# Patient Record
Sex: Female | Born: 1960 | Race: Black or African American | Hispanic: No | Marital: Single | Smoking: Current every day smoker
Health system: Southern US, Community
[De-identification: ages and names within clinical notes are randomized; demographics above are authoritative.]

## PROBLEM LIST (undated history)

## (undated) DIAGNOSIS — I10 Essential (primary) hypertension: Secondary | ICD-10-CM

---

## 2018-01-16 ENCOUNTER — Encounter (HOSPITAL_BASED_OUTPATIENT_CLINIC_OR_DEPARTMENT_OTHER): Payer: Self-pay

## 2018-01-16 ENCOUNTER — Emergency Department (HOSPITAL_BASED_OUTPATIENT_CLINIC_OR_DEPARTMENT_OTHER): Payer: Self-pay

## 2018-01-16 ENCOUNTER — Other Ambulatory Visit: Payer: Self-pay

## 2018-01-16 ENCOUNTER — Emergency Department (HOSPITAL_BASED_OUTPATIENT_CLINIC_OR_DEPARTMENT_OTHER)
Admission: EM | Admit: 2018-01-16 | Discharge: 2018-01-16 | Disposition: A | Discharge status: Home / Self Care | Payer: Self-pay | Attending: Emergency Medicine | Admitting: Emergency Medicine

## 2018-01-16 DIAGNOSIS — M25561 Pain in right knee: Secondary | ICD-10-CM | POA: Insufficient documentation

## 2018-01-16 DIAGNOSIS — F17210 Nicotine dependence, cigarettes, uncomplicated: Secondary | ICD-10-CM | POA: Insufficient documentation

## 2018-01-16 DIAGNOSIS — I10 Essential (primary) hypertension: Secondary | ICD-10-CM | POA: Insufficient documentation

## 2018-01-16 DIAGNOSIS — M25461 Effusion, right knee: Secondary | ICD-10-CM | POA: Insufficient documentation

## 2018-01-16 HISTORY — DX: Essential (primary) hypertension: I10

## 2018-01-16 MED ORDER — IBUPROFEN 800 MG PO TABS
800.0000 mg | ORAL_TABLET | Freq: Once | ORAL | Status: AC
  Administered 2018-01-16: 800 mg via ORAL
  Filled 2018-01-16: qty 1

## 2018-01-16 NOTE — ED Provider Notes (Signed)
Emergency Department Provider Note   I have reviewed the triage vital signs and the nursing notes.   HISTORY  Chief Complaint Knee Pain   HPI Tracey Richards is a 57 y.o. female with PMH of HTN presents to the emergency department evaluation of right knee pain with suprapatellar swelling.  Patient has had symptoms for the past 2 months.  She has not taken any over-the-counter medications.  She is now walking with a cane.  No injury.  Denies any fevers, chills, rash.  She noticed pain getting worse over the past several days so presents to the emergency department.  She does not follow regularly with her primary care physician. No falls. No numbness/weakness.   Past Medical History:  Diagnosis Date  . Hypertension     There are no active problems to display for this patient.   History reviewed. No pertinent surgical history.   Allergies Patient has no known allergies.  No family history on file.  Social History Social History   Tobacco Use  . Smoking status: Current Every Day Smoker    Types: Cigarettes  . Smokeless tobacco: Never Used  Substance Use Topics  . Alcohol use: Yes    Comment: occ  . Drug use: Never    Review of Systems  Constitutional: No fever/chills Eyes: No visual changes. ENT: No sore throat. Cardiovascular: Denies chest pain. Respiratory: Denies shortness of breath. Gastrointestinal: No abdominal pain.  No nausea, no vomiting.  No diarrhea.  No constipation. Genitourinary: Negative for dysuria. Musculoskeletal: Positive right knee pain.  Skin: Negative for rash. Neurological: Negative for headaches, focal weakness or numbness.  10-point ROS otherwise negative.  ____________________________________________   PHYSICAL EXAM:  VITAL SIGNS: ED Triage Vitals  Enc Vitals Group     BP 01/16/18 1112 (!) 189/100     Pulse Rate 01/16/18 1112 80     Resp 01/16/18 1112 18     Temp 01/16/18 1112 98.1 F (36.7 C)     Temp Source 01/16/18  1112 Oral     SpO2 01/16/18 1112 96 %     Weight 01/16/18 1111 188 lb (85.3 kg)     Height 01/16/18 1111 5\' 5"  (1.651 m)     Pain Score 01/16/18 1110 10   Constitutional: Alert and oriented. Well appearing and in no acute distress. Eyes: Conjunctivae are normal.  Head: Atraumatic. Nose: No congestion/rhinnorhea. Mouth/Throat: Mucous membranes are moist.  Neck: No stridor.   Cardiovascular: Well perfused.   Respiratory: Normal respiratory effort.  Gastrointestinal: No distention.  Musculoskeletal: Right suprapatellar swelling without warmth or rash. Normal ROM of the right knee. No patellar tenderness.  Neurologic: No gross focal neurologic deficits are appreciated.  Skin:  Skin is warm, dry and intact. No rash noted.  ____________________________________________  RADIOLOGY  Dg Knee 2 Views Right  Result Date: 01/16/2018 CLINICAL DATA:  Pain and swelling RIGHT knee for 4 months, denies injury, increased pain with bending knee EXAM: RIGHT KNEE - 1-2 VIEW COMPARISON:  None FINDINGS: Osseous demineralization. Joint spaces preserved. Large joint effusion. No fracture, dislocation or bone destruction. Tiny marginal spurs at lateral compartment. IMPRESSION: Minimal degenerative changes and large joint effusion RIGHT knee. No acute osseous abnormality. Electronically Signed   By: Ulyses Southward M.D.   On: 01/16/2018 11:53    ____________________________________________   PROCEDURES  Procedure(s) performed:   Procedures  None ____________________________________________   INITIAL IMPRESSION / ASSESSMENT AND PLAN / ED COURSE  Pertinent labs & imaging results that were available during my  care of the patient were reviewed by me and considered in my medical decision making (see chart for details).  Patient presents to the emergency department with right knee pain and swelling.  She describes as ongoing for the past 2 months without injury.  No clinical concern for septic arthritis.   She does have some suprapatellar swelling noted.  Normal range of motion.  Plan for plain film to evaluate for obvious bony injury.   12:03 PM Patient with effusion on x-ray and arthritis. No other acute findings. Plan for compression, elevation, and OTC pain medications at home. Provided crutches for comfort here. Referred to PCP and Sports Med. Patient is without insurance so will start with PCP and move to specialist PRN.   At this time, I do not feel there is any life-threatening condition present. I have reviewed and discussed all results (EKG, imaging, lab, urine as appropriate), exam findings with patient. I have reviewed nursing notes and appropriate previous records.  I feel the patient is safe to be discharged home without further emergent workup. Discussed usual and customary return precautions. Patient and family (if present) verbalize understanding and are comfortable with this plan.  Patient will follow-up with their primary care provider. If they do not have a primary care provider, information for follow-up has been provided to them. All questions have been answered.  ____________________________________________  FINAL CLINICAL IMPRESSION(S) / ED DIAGNOSES  Final diagnoses:  Acute pain of right knee    MEDICATIONS GIVEN DURING THIS VISIT:  Medications  ibuprofen (ADVIL,MOTRIN) tablet 800 mg (800 mg Oral Given 01/16/18 1143)    Note:  This document was prepared using Dragon voice recognition software and may include unintentional dictation errors.  Alona Bene, MD Emergency Medicine    Mitsuye Schrodt, Arlyss Repress, MD 01/16/18 [phone]

## 2018-01-16 NOTE — ED Triage Notes (Signed)
C/o pain/swelling to right knee x "months"-denies injury-to triage in w/c-NAD

## 2018-01-16 NOTE — Discharge Instructions (Signed)
We believe that your symptoms are caused by musculoskeletal strain.  Please read through the included information about additional care such as heating pads, over-the-counter pain medicine(Tylenol and/or Motrin).  Remember that early mobility and using the affected part of your body is actually better than keeping it immobile.  Follow-up with the doctor listed as recommended or return to the emergency department with new or worsening symptoms that concern you.

## 2018-01-29 ENCOUNTER — Encounter: Payer: Self-pay | Admitting: Family Medicine

## 2018-01-29 ENCOUNTER — Ambulatory Visit (INDEPENDENT_AMBULATORY_CARE_PROVIDER_SITE_OTHER): Payer: Self-pay | Admitting: Family Medicine

## 2018-01-29 VITALS — BP 157/95 | HR 89 | Ht 64.0 in | Wt 188.0 lb

## 2018-01-29 DIAGNOSIS — M25461 Effusion, right knee: Secondary | ICD-10-CM

## 2018-01-29 MED ORDER — METHYLPREDNISOLONE ACETATE 40 MG/ML IJ SUSP
40.0000 mg | Freq: Once | INTRAMUSCULAR | Status: AC
  Administered 2018-01-29: 40 mg via INTRA_ARTICULAR

## 2018-01-29 NOTE — Patient Instructions (Signed)
We drained and injected your knee today. We've sent the fluid to the lab for analysis - we will contact you with the results and next steps. Ice 15 minutes at a time up to every hour. Continue with the compression sleeve, elevation. You can take ibuprofen 600mg  three times a day with food OR aleve 2 tabs twice a day with food for pain and inflammation. Ok to take tylenol, use topical medications (like capsaicin, aspercreme) with this. Follow up will depend on the lab results.

## 2018-01-29 NOTE — Progress Notes (Signed)
PCP: Patient, No Pcp Per  Subjective:   HPI: Patient is a 57 y.o. female here for right knee pain.  Patient denies known injury or trauma. She reports about 3 months ago she woke up with pain and swelling of right knee. Both have persisted and pain is 10/10, sharp, worse with bearing weight. Knee feels unstable as a result. No catching, locking but has popping with pain medially. No history of gout or RA. Wearing sleeve, tried ibuprofen. No skin changes, numbness, fevers, chills, sweats.  Past Medical History:  Diagnosis Date  . Hypertension     No current outpatient medications on file prior to visit.   No current facility-administered medications on file prior to visit.     History reviewed. No pertinent surgical history.  No Known Allergies  Social History   Socioeconomic History  . Marital status: Single    Spouse name: Not on file  . Number of children: Not on file  . Years of education: Not on file  . Highest education level: Not on file  Occupational History  . Not on file  Social Needs  . Financial resource strain: Not on file  . Food insecurity:    Worry: Not on file    Inability: Not on file  . Transportation needs:    Medical: Not on file    Non-medical: Not on file  Tobacco Use  . Smoking status: Current Every Day Smoker    Types: Cigarettes  . Smokeless tobacco: Never Used  Substance and Sexual Activity  . Alcohol use: Yes    Comment: occ  . Drug use: Never  . Sexual activity: Not on file  Lifestyle  . Physical activity:    Days per week: Not on file    Minutes per session: Not on file  . Stress: Not on file  Relationships  . Social connections:    Talks on phone: Not on file    Gets together: Not on file    Attends religious service: Not on file    Active member of club or organization: Not on file    Attends meetings of clubs or organizations: Not on file    Relationship status: Not on file  . Intimate partner violence:    Fear of  current or ex partner: Not on file    Emotionally abused: Not on file    Physically abused: Not on file    Forced sexual activity: Not on file  Other Topics Concern  . Not on file  Social History Narrative  . Not on file    History reviewed. No pertinent family history.  BP (!) 157/95   Pulse 89   Ht 5\' 4"  (1.626 m)   Wt 188 lb (85.3 kg)   BMI 32.27 kg/m   Review of Systems: See HPI above.     Objective:  Physical Exam:  Gen: NAD, comfortable in exam room  Right knee: Large effusion.  No other gross deformity, ecchymoses.  No erythema. TTP diffusely anterior knee including joint lines, post patellar facets. ROM 0 - 90 degrees with 5/5 strength. Negative ant/post drawers. Negative valgus/varus testing. Negative lachmans. Negative mcmurrays, apleys, patellar apprehension. NV intact distally.  Left knee: No deformity. FROM with 5/5 strength. No tenderness to palpation. NVI distally.   Assessment & Plan:  1. Right knee pain with effusion - knee aspirated and fluid sent to lab for analysis.  Steroid injection given as well today.  Icing, compression, elevation.  Tylenol, topical medications, aleve or  ibuprofen.  F/u will depend on lab results.  After informed written consent timeout was performed, patient was lying supine on exam table.  Right knee was prepped with alcohol swab.  Utilizing superolateral approach, 3 mL of bupivicaine was used for local anesthesia.  Then using an 18g needle on 60cc syringe,54 mL of clear straw-colored fluid was aspirated from right knee.  Knee was then injected with 3:1 bupivicaine:depomedrol.  Patient tolerated procedure well without immediate complications

## 2018-01-29 NOTE — Addendum Note (Signed)
Addended by: Kathi Simpers F on: 01/29/2018 12:07 PM   Modules accepted: Orders

## 2018-01-30 LAB — SYNOVIAL FLUID, CELL COUNT
Eos, Fluid: 0 %
LINING CELLS, SYNOVIAL: 0 %
Lymphs, Fluid: 40 %
MACROPHAGES FLD: 30 %
NUC CELL # FLD: 309 {cells}/uL — AB (ref 0–200)
Polys, Fluid: 30 %
RBC, Fluid: 3000 /uL

## 2018-02-02 LAB — BODY FLUID CULTURE

## 2020-02-11 IMAGING — DX DG KNEE 1-2V*R*
2 series · 2 of 2 positions shown · non-contrast
Comparison: None

CLINICAL DATA: Pain and swelling RIGHT knee for 4 months, denies
injury, increased pain with bending knee

EXAM:
RIGHT KNEE - 1-2 VIEW

[knee ap]
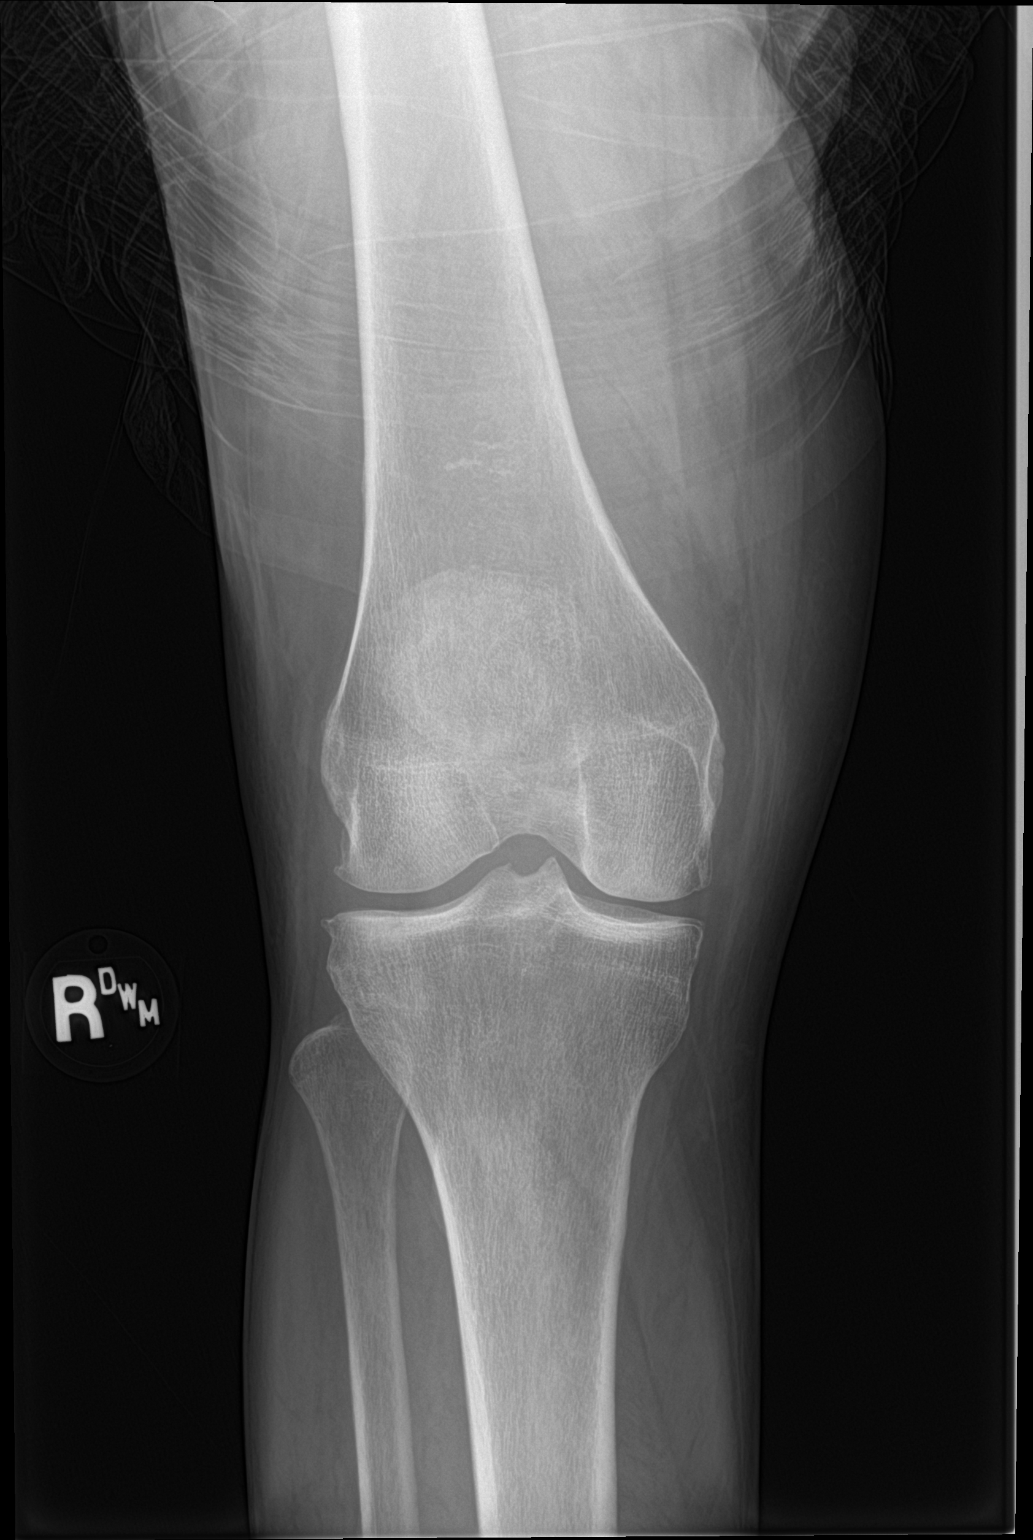

[knee lat]
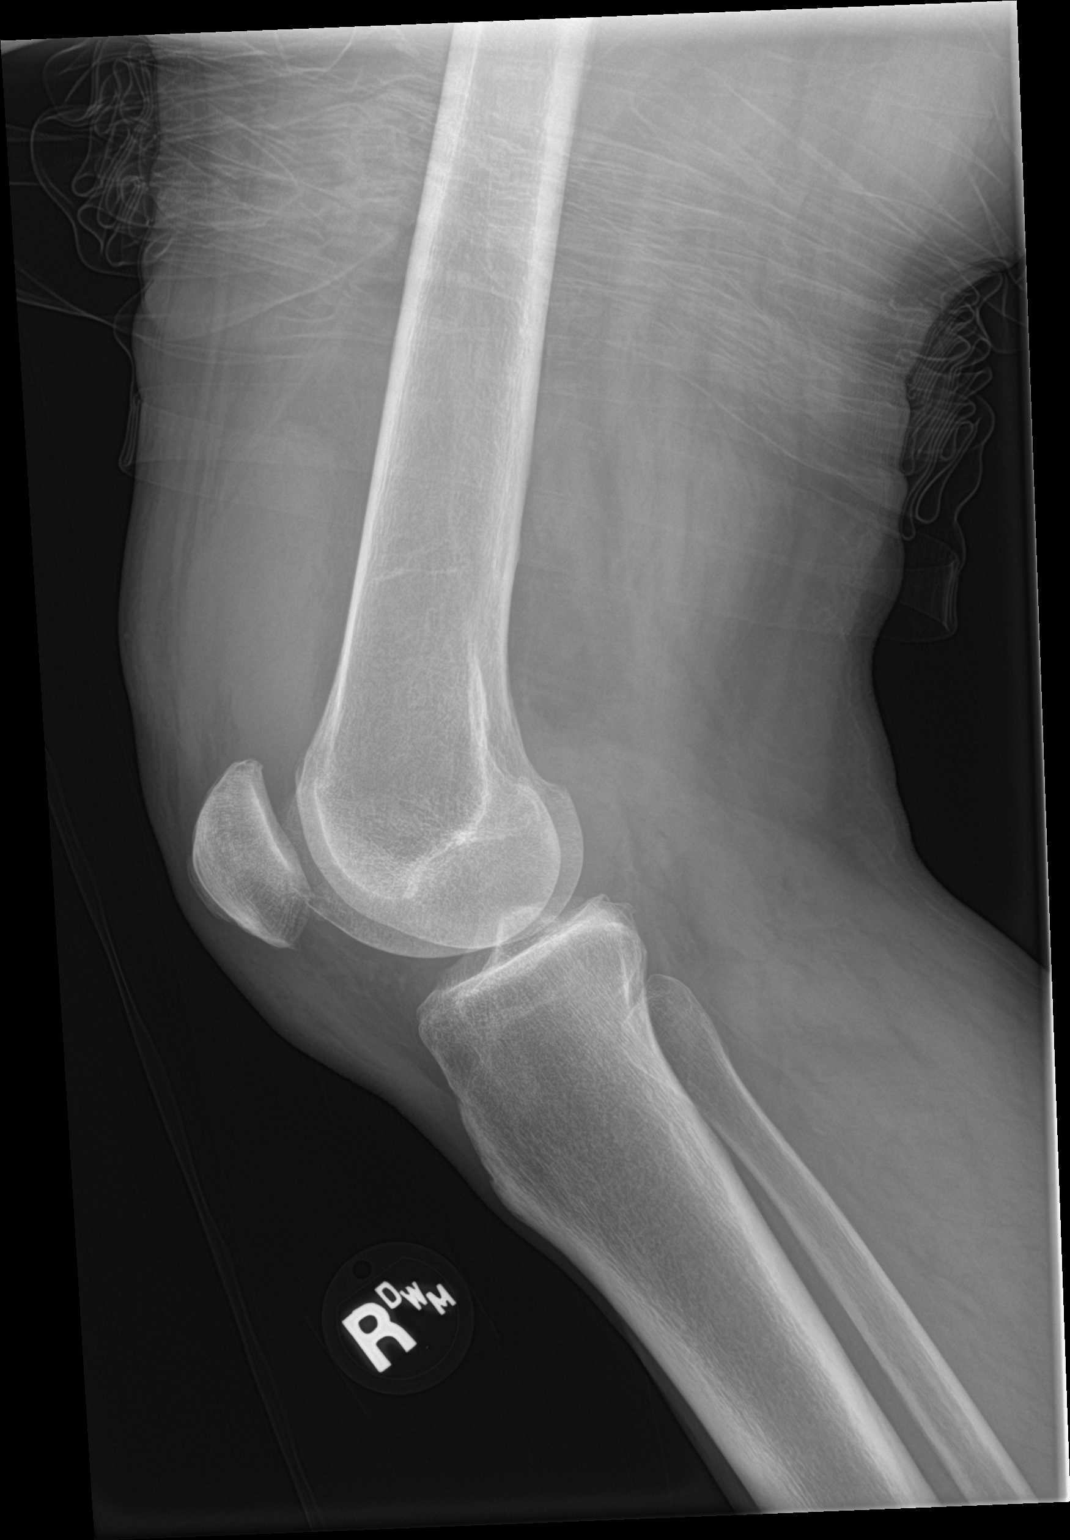

[2 of 2 positions shown; findings below may reference images not displayed]

FINDINGS: Osseous demineralization.

Joint spaces preserved.

Large joint effusion.

No fracture, dislocation or bone destruction.

Tiny marginal spurs at lateral compartment.
IMPRESSION: Minimal degenerative changes and large joint effusion RIGHT knee.

No acute osseous abnormality.
